# Patient Record
Sex: Male | Born: 1985 | Race: White | Hispanic: No | Marital: Single | Smoking: Never smoker
Health system: Southern US, Community
[De-identification: ages and names within clinical notes are randomized; demographics above are authoritative.]

---
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
## 2010-09-01 ENCOUNTER — Encounter: Admission: RE | Admit: 2010-09-01 | Discharge: 2010-09-01 | Payer: Self-pay | Admitting: Family Medicine

## 2010-12-19 ENCOUNTER — Encounter: Payer: Self-pay | Admitting: Family Medicine

## 2011-09-24 ENCOUNTER — Inpatient Hospital Stay (HOSPITAL_COMMUNITY)
Admission: RE | Admit: 2011-09-24 | Discharge: 2011-09-30 | Disposition: A | Discharge status: Home / Self Care | Payer: Medicaid Other | Source: Ambulatory Visit | Attending: Pulmonary Disease | Admitting: Pulmonary Disease

## 2011-09-24 ENCOUNTER — Inpatient Hospital Stay: Admit: 2011-09-24 | Payer: Self-pay

## 2011-09-24 ENCOUNTER — Inpatient Hospital Stay (HOSPITAL_COMMUNITY)
Admission: EM | Admit: 2011-09-24 | Discharge: 2011-09-24 | DRG: 682 | Disposition: A | Discharge status: Other Acute Inpatient Hospital | Payer: Medicaid Other | Attending: Internal Medicine | Admitting: Internal Medicine

## 2011-09-24 ENCOUNTER — Emergency Department (HOSPITAL_COMMUNITY): Payer: Medicaid Other

## 2011-09-24 DIAGNOSIS — Z79899 Other long term (current) drug therapy: Secondary | ICD-10-CM

## 2011-09-24 DIAGNOSIS — F840 Autistic disorder: Secondary | ICD-10-CM | POA: Diagnosis present

## 2011-09-24 DIAGNOSIS — T438X5A Adverse effect of other psychotropic drugs, initial encounter: Secondary | ICD-10-CM | POA: Diagnosis present

## 2011-09-24 DIAGNOSIS — R509 Fever, unspecified: Secondary | ICD-10-CM | POA: Diagnosis not present

## 2011-09-24 DIAGNOSIS — E876 Hypokalemia: Secondary | ICD-10-CM | POA: Diagnosis not present

## 2011-09-24 DIAGNOSIS — I469 Cardiac arrest, cause unspecified: Secondary | ICD-10-CM | POA: Diagnosis not present

## 2011-09-24 DIAGNOSIS — R569 Unspecified convulsions: Secondary | ICD-10-CM | POA: Diagnosis not present

## 2011-09-24 DIAGNOSIS — E871 Hypo-osmolality and hyponatremia: Secondary | ICD-10-CM | POA: Diagnosis present

## 2011-09-24 DIAGNOSIS — R55 Syncope and collapse: Secondary | ICD-10-CM | POA: Diagnosis not present

## 2011-09-24 DIAGNOSIS — I498 Other specified cardiac arrhythmias: Secondary | ICD-10-CM | POA: Diagnosis not present

## 2011-09-24 DIAGNOSIS — N179 Acute kidney failure, unspecified: Principal | ICD-10-CM | POA: Diagnosis present

## 2011-09-24 DIAGNOSIS — R197 Diarrhea, unspecified: Secondary | ICD-10-CM | POA: Diagnosis present

## 2011-09-24 DIAGNOSIS — F79 Unspecified intellectual disabilities: Secondary | ICD-10-CM | POA: Diagnosis present

## 2011-09-24 DIAGNOSIS — K7689 Other specified diseases of liver: Secondary | ICD-10-CM | POA: Diagnosis present

## 2011-09-24 DIAGNOSIS — G9340 Encephalopathy, unspecified: Secondary | ICD-10-CM | POA: Diagnosis not present

## 2011-09-24 DIAGNOSIS — I10 Essential (primary) hypertension: Secondary | ICD-10-CM | POA: Diagnosis present

## 2011-09-24 DIAGNOSIS — J984 Other disorders of lung: Secondary | ICD-10-CM | POA: Diagnosis not present

## 2011-09-24 DIAGNOSIS — E7800 Pure hypercholesterolemia, unspecified: Secondary | ICD-10-CM | POA: Diagnosis present

## 2011-09-24 LAB — DIFFERENTIAL
Eosinophils Absolute: 0.1 10*3/uL (ref 0.0–0.7)
Eosinophils Relative: 1 % (ref 0–5)
Lymphocytes Relative: 11 % — ABNORMAL LOW (ref 12–46)
Lymphocytes Relative: 9 % — ABNORMAL LOW (ref 12–46)
Lymphs Abs: 1.5 10*3/uL (ref 0.7–4.0)
Lymphs Abs: 1 10*3/uL (ref 0.7–4.0)
Monocytes Relative: 6 % (ref 3–12)
Neutro Abs: 11 10*3/uL — ABNORMAL HIGH (ref 1.7–7.7)
Neutrophils Relative %: 82 % — ABNORMAL HIGH (ref 43–77)
Neutrophils Relative %: 85 % — ABNORMAL HIGH (ref 43–77)

## 2011-09-24 LAB — LITHIUM LEVEL: Lithium Lvl: 4.17 mEq/L (ref 0.80–1.40)

## 2011-09-24 LAB — LIPID PANEL
Cholesterol: 174 mg/dL (ref 0–200)
HDL: 37 mg/dL — ABNORMAL LOW (ref >39)
Total CHOL/HDL Ratio: 4.7 RATIO
Triglycerides: 172 mg/dL — ABNORMAL HIGH (ref <150)

## 2011-09-24 LAB — CBC
HCT: 39.2 % (ref 39.0–52.0)
HCT: 38.1 % — ABNORMAL LOW (ref 39.0–52.0)
Hemoglobin: 13.2 g/dL (ref 13.0–17.0)
MCH: 30.1 pg (ref 26.0–34.0)
MCV: 90.3 fL (ref 78.0–100.0)
MCV: 90.3 fL (ref 78.0–100.0)
Platelets: 253 10*3/uL (ref 150–400)
RBC: 4.34 MIL/uL (ref 4.22–5.81)
RBC: 4.22 MIL/uL (ref 4.22–5.81)
WBC: 13.4 10*3/uL — ABNORMAL HIGH (ref 4.0–10.5)
WBC: 12.2 10*3/uL — ABNORMAL HIGH (ref 4.0–10.5)

## 2011-09-24 LAB — CARDIAC PANEL(CRET KIN+CKTOT+MB+TROPI)
CK, MB: 1.2 ng/mL (ref 0.3–4.0)
Relative Index: INVALID (ref 0.0–2.5)
Total CK: 46 U/L (ref 7–232)
Troponin I: <0.3 ng/mL (ref <0.30)

## 2011-09-24 LAB — URINE MICROSCOPIC-ADD ON

## 2011-09-24 LAB — URINALYSIS, ROUTINE W REFLEX MICROSCOPIC
Bilirubin Urine: NEGATIVE
Glucose, UA: NEGATIVE mg/dL
Hgb urine dipstick: NEGATIVE
Specific Gravity, Urine: 1.007 (ref 1.005–1.030)
pH: 6.5 (ref 5.0–8.0)

## 2011-09-24 LAB — COMPREHENSIVE METABOLIC PANEL
AST: 31 U/L (ref 0–37)
Albumin: 4.6 g/dL (ref 3.5–5.2)
Alkaline Phosphatase: 110 U/L (ref 39–117)
Chloride: 100 mEq/L (ref 96–112)
Creatinine, Ser: 2.25 mg/dL — ABNORMAL HIGH (ref 0.50–1.35)
Potassium: 4.1 mEq/L (ref 3.5–5.1)
Total Bilirubin: 0.1 mg/dL — ABNORMAL LOW (ref 0.3–1.2)
Total Protein: 7.8 g/dL (ref 6.0–8.3)

## 2011-09-24 LAB — HEMOGLOBIN A1C
Hgb A1c MFr Bld: 5.4 % (ref <5.7)
Mean Plasma Glucose: 108 mg/dL (ref <117)

## 2011-09-24 LAB — MAGNESIUM: Magnesium: 2.8 mg/dL — ABNORMAL HIGH (ref 1.5–2.5)

## 2011-09-24 LAB — LACTIC ACID, PLASMA: Lactic Acid, Venous: 1.6 mmol/L (ref 0.5–2.2)

## 2011-09-24 LAB — MRSA PCR SCREENING: MRSA by PCR: NEGATIVE

## 2011-09-24 LAB — PHOSPHORUS: Phosphorus: 3 mg/dL (ref 2.3–4.6)

## 2011-09-25 ENCOUNTER — Inpatient Hospital Stay (HOSPITAL_COMMUNITY): Payer: Medicaid Other

## 2011-09-25 DIAGNOSIS — I469 Cardiac arrest, cause unspecified: Secondary | ICD-10-CM

## 2011-09-25 DIAGNOSIS — I495 Sick sinus syndrome: Secondary | ICD-10-CM

## 2011-09-25 DIAGNOSIS — F79 Unspecified intellectual disabilities: Secondary | ICD-10-CM

## 2011-09-25 LAB — ALT: ALT: 57 U/L — ABNORMAL HIGH (ref 0–53)

## 2011-09-25 LAB — CBC
Hemoglobin: 12.2 g/dL — ABNORMAL LOW (ref 13.0–17.0)
MCH: 30.9 pg (ref 26.0–34.0)
MCHC: 34.2 g/dL (ref 30.0–36.0)
Platelets: 221 10*3/uL (ref 150–400)
RDW: 12.7 % (ref 11.5–15.5)

## 2011-09-25 LAB — GLUCOSE, CAPILLARY
Glucose-Capillary: 107 mg/dL — ABNORMAL HIGH (ref 70–99)
Glucose-Capillary: 128 mg/dL — ABNORMAL HIGH (ref 70–99)
Glucose-Capillary: 113 mg/dL — ABNORMAL HIGH (ref 70–99)

## 2011-09-25 LAB — CK TOTAL AND CKMB (NOT AT ARMC)
Relative Index: INVALID (ref 0.0–2.5)
Total CK: 36 U/L (ref 7–232)

## 2011-09-25 LAB — BASIC METABOLIC PANEL
Calcium: 10.2 mg/dL (ref 8.4–10.5)
GFR calc Af Amer: 66 mL/min — ABNORMAL LOW (ref >90)
GFR calc non Af Amer: 57 mL/min — ABNORMAL LOW (ref >90)
Sodium: 139 mEq/L (ref 135–145)

## 2011-09-25 LAB — LITHIUM LEVEL: Lithium Lvl: 1.22 mEq/L (ref 0.80–1.40)

## 2011-09-25 LAB — HEPATITIS B SURFACE ANTIGEN: Hepatitis B Surface Ag: NEGATIVE

## 2011-09-25 LAB — URINE CULTURE: Colony Count: 2000

## 2011-09-25 LAB — TROPONIN I: Troponin I: <0.3 ng/mL (ref <0.30)

## 2011-09-25 LAB — HEPATITIS B CORE ANTIBODY, TOTAL: Hep B Core Total Ab: NEGATIVE

## 2011-09-25 NOTE — Consult Note (Signed)
[name], Marshall NO.:  [id number]  MEDICAL RECORD NO.:  [id number]  LOCATION:  2609                         FACILITY:  [facility]  PHYSICIAN:  Maree Krabbe, M.D.DATE OF BIRTH:  Feb 10, 1986  DATE OF CONSULTATION:  09/24/2011 DATE OF DISCHARGE:                                CONSULTATION   REASON FOR CONSULT:  Lithium toxicity and need for dialysis.  HISTORY:  [name], mental retardation and high blood pressure  brought in by his primary caregiver for altered mental status.  He has been having confusion and lethargy for the last 6 days.  Usually, he is able to ambulate and feed himself.  His Haldol dose was decreased but that did not help.  He started having diarrhea also.  He was evaluated in the emergency room and found to have an elevated creatinine at 2.2 and a lithium level of 4.17, which is considered to be markedly elevated.  The Renal Service was asked to see the patient for consideration of hemodialysis for lithium toxicity.  The  urinalysis done by the lab was entirely negative except for 3-6 white blood cells.  PAST MEDICAL HISTORY: 1. Autism. 2. Mental retardation. 3. Dyslipidemia. 4. Hypertension.  SURGICAL HISTORY:  Noncontributory.  ALLERGIES:  None.  SOCIAL HISTORY:  No history of tobacco, alcohol or drug abuse.  FAMILY HISTORY:  None.  MEDICATIONS:  At home are: 1. Lisinopril 20 daily. 2. Lithium 300 mg 3 caps b.i.d. 3. Minocycline 100 daily for acne. 4. Topiramate 50 b.i.d. 5. Haldol 2 in the morning and 2 at night.  REVIEW OF SYSTEMS:  The patient is not responding.  The caregiver denies any recent evidence of shortness of breath, fever, chills, sweats, vomiting, difficulty voiding, joint swelling.  PHYSICAL EXAMINATION:  VITAL SIGNS:  Blood pressure 130/80, pulse 130, respirations 20, temp 99.3, O2 saturation 100% on room air. GENERAL:  The patient is in no distress.  He will  verbalize intermittently.  He has been receiving some Ativan, so he is a bit sedated. SKIN:  Warm and dry without rash, cyanosis, or edema.  He does have facial acne. HEENT:  PERRLA, EOMI.  Throat is moist. NECK:  Supple without JVD. CHEST:  Clear throughout.  No rales, rhonchi, or wheezing. CARDIAC:  Regular rate and rhythm without murmur, rub, or gallop. ABDOMEN:  Soft, nontender, normoactive bowel sounds. GU:  Normal male genitalia. EXTREMITIES:  No lower extremity edema or no joint effusions or deformity.  Pulses are excellent in the feet. NEUROLOGIC:  He is moving all his arms and moving his extremities all equally well.  No gross cranial nerve deficits.  LABORATORY DATA:  Sodium 129, potassium 4.1, bicarb 21, BUN 49, creatinine 2.25, glucose 116, calcium 10.9.  White count 13,000, hemoglobin 13.  Head CT showed no acute changes.  Chest x-ray unremarkable.  Lithium level was 4.17, normal range is 0.8-1.4.  IMPRESSION: 1. Altered mental status due to lithium toxicity.  Lithium level is     dangerously high.  Acute hemodialysis is indicated.  We will place a     temporary catheter and plan acute dialysis this evening.  He may  need another treatment depending on levels.  Would also keep in     touch with poison control during this process. 2. Renal insufficiency, probably acute.  This is most likely due to     dehydration with the patient's recent confusion, poor oral intake     and diarrhea.  Urinalysis is negative. 3. Leukocytosis. 4. Hypertension.  Would hold ACE inhibitor for now with elevated     creatinine. 5. If blood pressure medications are needed, use intravenous beta-blocker     or labetalol or metoprolol.  We will put a p.r.n. order for     labetalol. 6. Autism with mental retardation.  PLAN:  See orders.     Maree Krabbe, M.D.     RDS/MEDQ  D:  09/24/2011  T:  09/25/2011  Job:  [id number]  Electronically Signed by Delano Metz M.D. on 09/25/2011  12:53:22 PM

## 2011-09-25 NOTE — H&P (Signed)
NAME:  Douglas Marshall, Douglas Marshall NO.:  [id number]  MEDICAL RECORD NO.:  [id number]  LOCATION:  WLED                         FACILITY:  [facility]  PHYSICIAN:  Manson Passey, MD        DATE OF BIRTH:  October 25, 1986  DATE OF ADMISSION:  09/24/2011 DATE OF DISCHARGE:                             HISTORY & PHYSICAL   PRIMARY CARE PHYSICIAN:  Tomma Lightning, MD  CHIEF COMPLAINT:  Altered mental status for about 1 week.  HISTORY OF PRESENT ILLNESS:  The patient is a 25 year old male with a history of autism, mental retardation, dyslipidemia (diet controlled), hypertension, who was brought by primary care giver Gweneth Fritter) for change in the mental status about 6 days ago prior to the admission.  As per primary care giver, the patient started acting strange, which she describes as not being to the able to express himself and when he does the response is slower and has lots of pauses.  The patient is unable to complete the sentences whereas his usual mental status is that he is able to focus and give appropriate responses when asked questions, he follows commands appropriately.  She has called the patient's primary psychiatrist and was told to decrease the dose of Haldol from his usual dosage 4 mg at bedtime to 2 mg at bedtime. Primary care giver explained that she thought it is going to take a few days before the medication starts ,working so they have not seek medical attention prior to today.  Primary caregiver also notes that the patient did have over last month or so, some behavioral changes where he was more psychotic.  She also reports that the patient started having diarrhea since Wednesday, with no blood in it, few episodes a day with the last episode being on the morning of admission.  In addition, she reports that the patient started drooling excessively over the last few days or so.  There are no complaints of chest pain or cough or shortness of breath.  There are  no complaints of nausea or vomiting or abdominal pain.  No fever or chills or night sweats.  No complaints of lightheadedness or dizziness or loss of consciousness or blurry vision. There are no reports of blood in the stool or urine.  The patient is admitted to hospitalist service for further evaluation and management.  PAST MEDICAL HISTORY: 1. Autism. 2. Mental retardation. 3. Dyslipidemia. 4. Hypertension.  SURGICAL HISTORY:  Noncontributing.  ALLERGIES:  No known drug allergies.  SOCIAL HISTORY:  The patient has no history of smoking cigarettes, alcohol abuse, or illegal drug abuse.  FAMILY HISTORY:  No significant medical family history.  MEDICATIONS AT HOME: 1. Lisinopril 20 mg daily. 2. Lithium carbonate 300 mg 3 caps twice daily. 3. Minocycline 100 mg daily for acne. 4. Topiramate 50 mg twice daily. 5. Haldol 2 mg in the morning and 2 mg at night. 6. Lisinopril 10 mg 2 tablets daily.  REVIEW OF SYSTEMS:  As per HPI.  PHYSICAL EXAMINATION:  VITAL SIGNS:  Blood pressure 135/81, pulse 123, respiration 20, temperature 99.3 Fahrenheit, oxygen saturation 100% on room air. GENERAL APPEARANCE:  The patient is in no acute  distress, but does not exhibit occasional episodes of agitation. HEENT:  Normocephalic/atraumatic, pupils equally round, reactive to light and accommodation, extraocular muscles intact, no tonsillar erythema or exudates. NECK:  Supple, no lymphadenopathy, no JVD. LUNGS:  Clear to auscultation bilaterally, no wheezing, no rhonchi, no rales. CARDIOVASCULAR:  Positive S1, S2, regular rate and rhythm, the patient is tachycardic. ABDOMEN:  Positive bowel sounds, soft, nontender/nondistended. EXTREMITIES:  Pulses palpable bilaterally, no lower extremity edema or cyanosis.  SKIN:  Warm, dry, facial acne. NEUROLOGIC:  Patient is alert, awake, and oriented to time, place, and person, but gives very slow responses with long pauses.  LABORATORY:  Sodium 129,  potassium 4.1, chloride 100, bicarb 21, BUN 49, creatinine 2.25, glucose 116, calcium 10.9, white blood cells 13.4, hemoglobin 13.2, hematocrit 39.2, platelets 239.  Lactic acid 1.6. Urinalysis trace leukocytes.  DIAGNOSTIC STUDIES: 1. Head CT without contrast shows no acute intracranial findings, mass     shift, or edema. 2. Chest x-ray shows no active cardiopulmonary disease.  ASSESSMENT AND PLAN:  25 year old male with a history of autism, mental retardation, hypertension, brought for altered mental status. 1. Hyponatremia.  Possible etiology includes dehydration versus     infection versus psychogenic polydipsia.  Patient's primary     caregiver reports that the patient has a habit of drinking a lot of     fluid as much as a 6 L a day and over last few days.  He has been     drinking about 3 L a day.  We will continue IV normal saline at 100     mL an hour, but we will restrict IV fluids to not more than 1.5 L a     day.  Continue to monitor sodium.  Further management is dependent     on sodium trend.  We will also start ceftriaxone for possible     urinary tract infection as the patient does have elevated whiteblood cell count, and trace leukocytes in the urinalysis.  We will     start ceftriaxone 1 g every 24 hours IV for 5 days.  We will also     send stool for stool culture, as well as C diff and we will start     Flagyl 500 mg every 8 hours IV until the final results of C diff. 2. Acute kidney injury.  This could perhaps be related to dehydration,     prerenal.  We will continue IV fluids up to 1.5 L a day, and     continue to monitor BUN and creatinine.  There are no previous     laboratory values to assess whether this is acute or chronic renal     insufficiency. 3. Hypertension.  At present, blood pressure is at goal 135/81.     Please continue lisinopril 20 mg daily once the patient is     evaluated by Speech and Swallow Therapy. 4. History of behavioral changes,  psychiatric history.  At present,     the patient exhibits occasional episodes of agitation.  Psychiatric     medications can be continued after speech and swallow evaluation     which includes topiramate 50 mg twice daily, Haldol 2 mg twice     daily.  Please hold off on lithium for now.  For agitation, we will     give Ativan 1 mg every 8 hours by IV as needed for agitation. 5. Hypercalcemia.  As of right now, it is unclear etiology of  hypercalcemia but the possibility include lithium.  Please obtain     lithium level and hold lithium temporarily until we see the     resolution of hypercalcemia. 6. Social work involvement for safe discharge plan.  We will obtain     PT, OT, and ST evaluation prior to patient's discharge. 7. Diet.  NPO until speech and swallow evaluation is completed. 8. Education.  Patient's primary caregiver is aware of plan of care     and treatment. Time spent admitting the patient more than 35 minutes.          ______________________________ Manson Passey, MD     AD/MEDQ  D:  09/24/2011  T:  09/24/2011  Job:  [id number]  Electronically Signed by Manson Passey MD on 09/25/2011 12:34:11 PM

## 2011-09-26 ENCOUNTER — Inpatient Hospital Stay (HOSPITAL_COMMUNITY): Payer: Medicaid Other

## 2011-09-26 DIAGNOSIS — F71 Moderate intellectual disabilities: Secondary | ICD-10-CM

## 2011-09-26 DIAGNOSIS — I517 Cardiomegaly: Secondary | ICD-10-CM

## 2011-09-26 DIAGNOSIS — T438X4A Poisoning by other psychotropic drugs, undetermined, initial encounter: Secondary | ICD-10-CM

## 2011-09-26 DIAGNOSIS — R569 Unspecified convulsions: Secondary | ICD-10-CM

## 2011-09-26 DIAGNOSIS — I469 Cardiac arrest, cause unspecified: Secondary | ICD-10-CM

## 2011-09-26 DIAGNOSIS — T438X1A Poisoning by other psychotropic drugs, accidental (unintentional), initial encounter: Secondary | ICD-10-CM

## 2011-09-26 DIAGNOSIS — T438X5A Adverse effect of other psychotropic drugs, initial encounter: Secondary | ICD-10-CM

## 2011-09-26 LAB — DIFFERENTIAL
Eosinophils Absolute: 0.4 10*3/uL (ref 0.0–0.7)
Monocytes Absolute: 1.3 10*3/uL — ABNORMAL HIGH (ref 0.1–1.0)
Neutrophils Relative %: 73 % (ref 43–77)

## 2011-09-26 LAB — COMPREHENSIVE METABOLIC PANEL
ALT: 50 U/L (ref 0–53)
Albumin: 3.4 g/dL — ABNORMAL LOW (ref 3.5–5.2)
Albumin: 4 g/dL (ref 3.5–5.2)
Alkaline Phosphatase: 91 U/L (ref 39–117)
Alkaline Phosphatase: 107 U/L (ref 39–117)
BUN: 10 mg/dL (ref 6–23)
CO2: 21 mEq/L (ref 19–32)
Chloride: 108 mEq/L (ref 96–112)
GFR calc Af Amer: >90 mL/min (ref >90)
GFR calc non Af Amer: >90 mL/min (ref >90)
Glucose, Bld: 143 mg/dL — ABNORMAL HIGH (ref 70–99)
Potassium: 4.5 mEq/L (ref 3.5–5.1)
Potassium: 3.8 mEq/L (ref 3.5–5.1)
Sodium: 135 mEq/L (ref 135–145)
Total Bilirubin: 0.6 mg/dL (ref 0.3–1.2)
Total Protein: 6.3 g/dL (ref 6.0–8.3)

## 2011-09-26 LAB — CBC
HCT: 34.8 % — ABNORMAL LOW (ref 39.0–52.0)
MCHC: 33.5 g/dL (ref 30.0–36.0)
MCHC: 33.3 g/dL (ref 30.0–36.0)
MCV: 90.2 fL (ref 78.0–100.0)
RDW: 12.5 % (ref 11.5–15.5)
RDW: 12.4 % (ref 11.5–15.5)
WBC: 15.6 10*3/uL — ABNORMAL HIGH (ref 4.0–10.5)

## 2011-09-26 LAB — LITHIUM LEVEL: Lithium Lvl: 1.53 mEq/L (ref 0.80–1.40)

## 2011-09-26 LAB — PROTIME-INR: INR: 1.07 (ref 0.00–1.49)

## 2011-09-26 LAB — CARDIAC PANEL(CRET KIN+CKTOT+MB+TROPI)
CK, MB: 1.5 ng/mL (ref 0.3–4.0)
Relative Index: INVALID (ref 0.0–2.5)
Total CK: 35 U/L (ref 7–232)
Troponin I: <0.3 ng/mL (ref <0.30)

## 2011-09-26 LAB — URINE CULTURE: Culture: NO GROWTH

## 2011-09-26 LAB — GLUCOSE, CAPILLARY: Glucose-Capillary: 144 mg/dL — ABNORMAL HIGH (ref 70–99)

## 2011-09-27 ENCOUNTER — Inpatient Hospital Stay (HOSPITAL_COMMUNITY): Payer: Medicaid Other

## 2011-09-27 DIAGNOSIS — I469 Cardiac arrest, cause unspecified: Secondary | ICD-10-CM

## 2011-09-27 DIAGNOSIS — F71 Moderate intellectual disabilities: Secondary | ICD-10-CM

## 2011-09-27 DIAGNOSIS — T438X4A Poisoning by other psychotropic drugs, undetermined, initial encounter: Secondary | ICD-10-CM

## 2011-09-27 DIAGNOSIS — T4391XA Poisoning by unspecified psychotropic drug, accidental (unintentional), initial encounter: Secondary | ICD-10-CM

## 2011-09-27 DIAGNOSIS — I495 Sick sinus syndrome: Secondary | ICD-10-CM

## 2011-09-27 LAB — POCT I-STAT 3, ART BLOOD GAS (G3+)
Acid-base deficit: 5 mmol/L — ABNORMAL HIGH (ref 0.0–2.0)
Bicarbonate: 18.1 mEq/L — ABNORMAL LOW (ref 20.0–24.0)
TCO2: 19 mmol/L (ref 0–100)
pCO2 arterial: 34.4 mmHg — ABNORMAL LOW (ref 35.0–45.0)
pCO2 arterial: 33.5 mmHg — ABNORMAL LOW (ref 35.0–45.0)
pH, Arterial: 7.343 — ABNORMAL LOW (ref 7.350–7.450)
pO2, Arterial: 76 mmHg — ABNORMAL LOW (ref 80.0–100.0)
pO2, Arterial: 88 mmHg (ref 80.0–100.0)

## 2011-09-27 LAB — CBC
MCH: 30.1 pg (ref 26.0–34.0)
MCV: 89.9 fL (ref 78.0–100.0)
Platelets: 161 10*3/uL (ref 150–400)
RDW: 12.2 % (ref 11.5–15.5)
WBC: 11.6 10*3/uL — ABNORMAL HIGH (ref 4.0–10.5)

## 2011-09-27 LAB — POCT I-STAT 3, VENOUS BLOOD GAS (G3P V)
Acid-base deficit: 4 mmol/L — ABNORMAL HIGH (ref 0.0–2.0)
Bicarbonate: 19.3 mEq/L — ABNORMAL LOW (ref 20.0–24.0)

## 2011-09-27 LAB — GLUCOSE, CAPILLARY: Glucose-Capillary: 125 mg/dL — ABNORMAL HIGH (ref 70–99)

## 2011-09-27 LAB — COMPREHENSIVE METABOLIC PANEL
AST: 15 U/L (ref 0–37)
Albumin: 3.3 g/dL — ABNORMAL LOW (ref 3.5–5.2)
Calcium: 9.6 mg/dL (ref 8.4–10.5)
Chloride: 108 mEq/L (ref 96–112)
Creatinine, Ser: 0.92 mg/dL (ref 0.50–1.35)

## 2011-09-27 NOTE — Consult Note (Signed)
Douglas Marshall, Douglas Marshall NO.:  [id number]  MEDICAL RECORD NO.:  [id number]  LOCATION:  2905                         FACILITY:  [facility]  PHYSICIAN:  Carmell Austria, MD        DATE OF BIRTH:  23-Dec-1985  DATE OF CONSULTATION:  09/26/2011 DATE OF DISCHARGE:                                CONSULTATION   CHIEF COMPLAINT:  "Rule out seizures."  HISTORY OF PRESENT ILLNESS:  This is a 25 year old male with history of mental retardation who has been on long-term lithium and on Haldol with recent additions of Topamax.  He came in with increased lethargy for several days and was found to have lithium toxicity with a level of 4.17.  He also came in with acute renal failure. He was having diarrhea and possibly had some dehydration related to Lithium toxicity prior to coming to the hospital.  He was also recently started on lisinopril.  This morning, his lithium went down to 1.53 and he was given 5 mg of Abilify, which he has never gotten in the past. Patient had also recieved Haldol. The patient was  given these medications for agitation and soon afterwards the patient had 2 episodes of asystole, lasting about 30 seconds.  During these episodes, the patient appeared to be staring off and seizures was suspected. Otherwise, the patient has a history of autism.  PAST MEDICAL HISTORY: 1. Autism. 2. Hypertension. 3. Hyperlipidemia.  MEDICATIONS ON ADMISSION:  Lisinopril, lithium was held, minocycline, Topamax, Haldol, Abilify was substituted.  ALLERGIES:  The patient has no known drug allergies.  SOCIAL HISTORY:  The patient lives with his caregiver.  No toxic habits.  FAMILY HISTORY:  No significant family history of heart disease.  PHYSICAL EXAMINATION:  VITAL SIGNS:  At this time, temp 99.4, pulse 114, respirations 26, blood pressure 114/26. NEUROLOGICAL:  Limited by the patient's underlying mental status.  The patient is minimally verbal.  He does say occasional simple  words.  He does attend to the examiner.  He follows some simple commands, but inconsistently. HEENT:  Extraocular movements were intact.  Pupils were equal, round, reactive to light and accommodation.  There was no nystagmus.  He had positive blink to threat bilaterally.  There was no facial asymmetry. Motor: grossly his motor function was intact, but a full exam was not  possible due to poor patient complaint.  Sensation was intact to pinprick  throughout. Cognition was intact to finger-to-nose bilaterally.   Reflexes were 2+ in the upper extremities, 2+ at the knees, 2+ at ankles.   Downgoing plantars.  Gait was deferred as patient is not allowed to ambulate at this time.  LABORATORY DATA:  As I mentioned, his lithium recently was 1.53, his cardiac panel was negative.  His CMP will note BUN was 10, creatinine was 1.0, glucose was 143.  His H and H significant for white count of 15.6, H and H is 11.6 and 34.8.  He is slightly anemic, platelets were 193.  Head CT was done 2 days ago showing no acute stroke.  Chest x-ray was done 2 days ago as well and showed no pneumonia.  IMPRESSION:  This is a  24 year old man with 2 hypotensive episodes secondary to asystole during which he appeared to be staring off into space and seizures was suspected.  My impression is that these events were most likely due to hypoperfusion of the brain.  During times when there is hypoperfusion of the brain, the patient can appear to be staring off and this is a frequent finding in patient with very low blood  pressure.  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  I will follow.          ______________________________ Carmell Austria, MD     DB/MEDQ  D:   09/26/2011  T:  09/26/2011  Job:  [id number]  Electronically Signed by Carmell Austria MD on 09/27/2011 11:50:56 AM

## 2011-09-27 NOTE — Procedures (Signed)
EEG NUMBER:  [id number]  This routine EEG was requested in this 25 year old man with a history of mental retardation and autism.  His lithium doses were adjusted recently secondary to behavioral issues.  His family states the patient has had increased drooling, altered mental status and has been somnolent for the last 4-5 days.  He also had been noted to start on Abilify recently.  His medications include haloperidol, lorazepam, topiramate, hydroxyzine, Phenergan, zolpidem and lithium.  The EEG was done with the patient awake.  During periods of maximal wakefulness no obvious alpha rhythm could be seen.  Background activities were composed by low-amplitude beta activities with intermixed poorly organized alpha activities.  There were frequent bursts of frontally dominant intermittent rhythmic =delta activities(FIRDA).  Photic stimulation did not produce a driving response.  Hyperventilation was not performed due to the patient's inability to comply.  The patient did not sleep.  CLINICAL INTERPRETATION:  This routine EEG done with the patient awake is abnormal.  Background activities, including the presence of frontally dominant intermittent rhythmic delta activities, are too slow suggesting a mild to moderate encephalopathy of nonspecific etiology. No interictal epileptiform discharges, electrographic seizures or nonconvulsive status epilepticus was seen.          ______________________________ Denton Meek, MD    [id number] D:  09/26/2011 14:09:13  T:  09/26/2011 14:44:05  Job #:  [id number]

## 2011-09-28 LAB — BASIC METABOLIC PANEL
CO2: 20 mEq/L (ref 19–32)
Calcium: 10.2 mg/dL (ref 8.4–10.5)
Chloride: 107 mEq/L (ref 96–112)
Creatinine, Ser: 0.84 mg/dL (ref 0.50–1.35)
Glucose, Bld: 112 mg/dL — ABNORMAL HIGH (ref 70–99)

## 2011-09-28 LAB — CBC
HCT: 32.7 % — ABNORMAL LOW (ref 39.0–52.0)
Hemoglobin: 11.3 g/dL — ABNORMAL LOW (ref 13.0–17.0)
MCH: 30.4 pg (ref 26.0–34.0)
MCV: 87.9 fL (ref 78.0–100.0)
RBC: 3.72 MIL/uL — ABNORMAL LOW (ref 4.22–5.81)
WBC: 8.9 10*3/uL (ref 4.0–10.5)

## 2011-09-29 DIAGNOSIS — I495 Sick sinus syndrome: Secondary | ICD-10-CM

## 2011-09-29 DIAGNOSIS — T438X4A Poisoning by other psychotropic drugs, undetermined, initial encounter: Secondary | ICD-10-CM

## 2011-09-29 DIAGNOSIS — T438X1A Poisoning by other psychotropic drugs, accidental (unintentional), initial encounter: Secondary | ICD-10-CM

## 2011-09-29 DIAGNOSIS — I469 Cardiac arrest, cause unspecified: Secondary | ICD-10-CM

## 2011-09-29 DIAGNOSIS — F71 Moderate intellectual disabilities: Secondary | ICD-10-CM

## 2011-09-29 DIAGNOSIS — T4391XA Poisoning by unspecified psychotropic drug, accidental (unintentional), initial encounter: Secondary | ICD-10-CM

## 2011-09-29 LAB — GLUCOSE, CAPILLARY
Glucose-Capillary: 156 mg/dL — ABNORMAL HIGH (ref 70–99)
Glucose-Capillary: 112 mg/dL — ABNORMAL HIGH (ref 70–99)
Glucose-Capillary: 112 mg/dL — ABNORMAL HIGH (ref 70–99)
Glucose-Capillary: 103 mg/dL — ABNORMAL HIGH (ref 70–99)

## 2011-09-29 LAB — BASIC METABOLIC PANEL
CO2: 20 mEq/L (ref 19–32)
Calcium: 9.8 mg/dL (ref 8.4–10.5)
Chloride: 107 mEq/L (ref 96–112)
Creatinine, Ser: 0.79 mg/dL (ref 0.50–1.35)
GFR calc Af Amer: >90 mL/min (ref >90)
Sodium: 140 mEq/L (ref 135–145)

## 2011-09-29 LAB — STOOL CULTURE

## 2011-09-29 LAB — LITHIUM LEVEL: Lithium Lvl: <0.25 mEq/L — ABNORMAL LOW (ref 0.80–1.40)

## 2011-09-30 LAB — GLUCOSE, CAPILLARY
Glucose-Capillary: 101 mg/dL — ABNORMAL HIGH (ref 70–99)
Glucose-Capillary: 148 mg/dL — ABNORMAL HIGH (ref 70–99)

## 2011-09-30 LAB — CULTURE, BLOOD (ROUTINE X 2)
Culture  Setup Time: 201210271722
Culture: NO GROWTH

## 2011-09-30 LAB — LIPID PANEL
HDL: 32 mg/dL — ABNORMAL LOW (ref >39)
LDL Cholesterol: 90 mg/dL (ref 0–99)
Total CHOL/HDL Ratio: 4.9 RATIO

## 2011-09-30 NOTE — Consult Note (Signed)
Douglas Marshall, Douglas Marshall NO.:  [id number]  MEDICAL RECORD NO.:  [id number]  LOCATION:  2905                         FACILITY:  [facility]  PHYSICIAN:  Douglas Buckles. Kue Fox, MDDATE OF BIRTH:  [birth year]  DATE OF CONSULTATION:  09/26/2011 DATE OF DISCHARGE:                                CONSULTATION   REQUESTING PHYSICIAN:  [facility].  REASON FOR CONSULTATION:  Recurrent sinus pauses with asystolic arrest.  Douglas Marshall is a 25 year old man with a history of mental retardation, autism, and hypertension.  He is unable to give adequate history at this point.  All history is obtained from a chart of the nurse and his primary caregiver, Douglas Marshall.  Apparently, he has a relatively high functioning autistic male.  He attends a group school.  Douglas Marshall says that he has been doing relatively well, however, in February began to have behavioral issues and was acting out.  At that time, his lithium dose was adjusted.  More recently, he was seen in his primary care doctor's office with his diastolic pressure well over 100.  He was started on lisinopril.  Over the past 4-5 days, he has had increased drooling and altered mental status.  He became quite somnolent.  She brought him to the emergency room for further evaluation.  He was found to have lithium toxicity with a lithium level of 4.2.  He was seen emergently by the Renal Service and underwent placement of a right groin Vas-Cath with emergent hemodialysis.  He was also started on Abilify. Several hours after being started on Abilify, he had intermittent bradycardia with brief pauses.  His caregiver then thought she noticed some seizure activity.  He developed a 15-second pause while on the step- down unit 2600.  He was treated with atropine and developed sinus tachycardia.  He has moved to the Intensive Care Unit.  This morning, he had recurrent pauses of 13 seconds and 35 seconds associated with loss of consciousness.   There was no witnessed seizure activity related to these events.  It was reported that he was just sleeping and then dropped his heart rate quickly and became even less responsive.  This recovered spontaneously.  He did not receive atropine again.  He is currently quite somnolent.  He is unable to answer any questions or follow commands.  He does respond to pain and he moves all 4 extremities spontaneously.  REVIEW OF SYSTEMS:  According to Douglas Marshall, he has not had any history of cardiac issues.  He has never had a cardiac workup.  There is no previous history of syncope.  He did have seizures as a young child, but this has been many years ago.  He has had some recent diarrhea, but all other systems are negative except for HPI and problem list.  PROBLEM LIST: 1. Autism. 2. Mental retardation. 3. Hypertension. 4. Hyperlipidemia.  MEDICATIONS ON ADMISSION:  Lisinopril 20 a day, lithium, minocycline, Topamax, Haldol.  Lithium has been held.  He is now on Abilify.  He has no known drug allergies.  SOCIAL HISTORY:  He lives with his caregiver.  He does not smoke or drink alcohol.  FAMILY HISTORY:  There is no  significant family history of cardiac disease known.  PHYSICAL EXAM:  GENERAL:  He is somnolent. VITAL SIGNS:  Blood pressure currently is 105/57 with a heart rate of 105.  She is saturating 98% on room air. HEENT:  Normal except for acne. NECK:  Supple.  No JVD.  Carotids are 2+ bilaterally without bruits. There is no lymphadenopathy or thyromegaly. CARDIAC:  PMI is not displaced. HEART:  Heart sounds are distant, but he is regular with no obvious murmurs. LUNGS:  Clear anteriorly. ABDOMEN:  Soft, nontender, and nondistended.  No hepatosplenomegaly.  No bruits palpated. EXTREMITIES:  Warm.  There is a right groin Vas-Cath which looks okay. No cyanosis, clubbing, or edema.  No rash. NEURO:  Once again, he is somnolent.  He responses to pain, but otherwise does not  respond to commands.  EKG shows sinus tach at 110 beats per minute.  There are no significant ST-T wave abnormalities.  QRS duration is 94 msec.  PR duration is 182 msec, QTc is 414 msec.  Sodium is 139, potassium 4.5, BUN is 35, creatinine 1.6 which is down from 2.2 on admission.  Lithium is down to 1.2.  White count 17.6, hemoglobin 12.2, and platelets are 221.  ASSESSMENT: 1. Asystolic arrest, recurrent. 2. Acute renal failure, resolving. 3. Acute respiratory distress. 4. Mental retardation and autism. 5. Lithium toxicity, status post emergent dialysis.  PLAN/DISCUSSION:  I have reviewed his EKG and tele strips.  His tele strips are quite impressive.  I am unsure what the etiology of his sinus arrest.  I think in the differential you can consider medications, seizure, and apnea.  Given his age and normal baseline EKG, I doubt that this is primary rhythm issue, however, I cannot exclude that completely. I would recommend discontinuation of Abilify, check a 2-D echo and thyroid panel as well as a consult Neurology for possible seizure workup.  If episodes continue, he will need a transvenous pacing until we get this sorted out.  I will discuss with Electrophysiology.  A total time spent 50 minutes.     Douglas Buckles. Douglas Machamer, Marshall     DRB/MEDQ  D:  09/26/2011  T:  09/26/2011  Job:  [id number]  Electronically Signed by Douglas Meres Marshall on 09/30/2011 02:10:47 PM

## 2011-10-02 NOTE — Consult Note (Signed)
NAME:  Douglas Marshall NO.:  [id number]  MEDICAL RECORD NO.:  [id number]  LOCATION:                                 FACILITY:  PHYSICIAN:  Conni Slipper, MDDATE OF BIRTH:  November 25, 1986  DATE OF CONSULTATION:09/25/2011 DATE OF DISCHARGE:                                CONSULTATION   Douglas Marshall is a 25 year old single Caucasian male with diagnosis of autism, mental retardation, and hypertension.  The patient was admitted to the [facility] for lithium toxicity.  The patient's lithium levels were 4.17 on admission, and he has also had elevated creatinine at 2.2.  Reportedly, the patient has been under the care of the caregiver for the last 60 years and before that he was in [place]. Reportedly, he has been taking his medication from outpatient psychiatric services at [facility].  The patient had his last medication evaluation in February 2012.  At that time, his medication was increased lithium 1500 daily to the 1800 daily.  His lithium level checked about a month and half ago was within normal level.  The patient reportedly has loose motions in the recent past, which might cause that his lithium levels become toxic.  He also started a high blood pressure medication, which may have contributed to it.  The patient reportedly has been lethargic and altered mental status, unable to respond.  He has been fooling when he was came in.  The patient has been getting stable at this current point.  He has been able to eat pureed diet and has IV fluids diet.  He is able to drink coke with ice, which he is frequently asking for.  PAST PSYCHIATRIC HISTORY:  The patient has no reported psychiatric admissions.  SOCIAL HISTORY:  He has been living with a caretaker.  His mother is legal guardian who is in [place] at this time.  Unable to get the more information from her side.  FAMILY HISTORY:  The patient has a family history of cholesterol  and high blood pressure.  MENTAL STATUS EXAMINATION:  The patient was lying down on his bed with IV fluids and catheter.  The patient has closed his eyes, but talking loudly with caretaker calling her name and asking for more drink.  The patient also has shaking on his extremities from time to time.  DIAGNOSES: 1. Lithium toxicity. 2. Autism with mental retardation. 3. Hyperlipidemia and hypertension.  TREATMENT PLAN:  Recommended no lithium therapy at this time. Recommended to increase his Topamax from 50 mg to 100 mg twice a day and he may use Risperdal up to 4 mg a day from 0.25 mg twice a day to 4 mg a day and alternative was Abilify, may start at 2 mg to maximize to about 20 mg a day for controlling irritability, agitation, and aggressive behaviors.  The patient's caretaker would like to see him off of his Haldol and lithium, which he has been taking for 6 years.  Agree with the request at this time.  If you have any more questions, please contact the psychiatric consult at [phone].  I appreciate the psychiatric consult on [facility]  Conni Slipper, MD     JRJ/MEDQ  D:  09/25/2011  T:  09/26/2011  Job:  [id number]  Electronically Signed by Leata Mouse MD on 10/02/2011 09:26:41 AM

## 2011-10-03 NOTE — Discharge Summary (Addendum)
NAMESIERRA, BISSONETTE NO.:  [id number]  MEDICAL RECORD NO.:  [id number]  LOCATION:  6737                         FACILITY:  [facility]  PHYSICIAN:  Leslye Peer, MD    DATE OF BIRTH:  04-06-1986  DATE OF ADMISSION:  09/24/2011 DATE OF DISCHARGE:  09/30/2011                              DISCHARGE SUMMARY   DISCHARGE DIAGNOSES: 1. Bradycardic arrest secondary to lithium toxicity. 2. Encephalopathy/questionable seizures. 3. Acute renal failure. 4. History of autism/mood disorder. 5. History of fatty liver. 6. History of hypertension/hyperlipidemia.  HOSPITAL CONSULTANTS: 1. Dr. Lyman Speller of [facility]. 2. Dr. Elsie Saas of psychiatry. 3. Dr. Arvilla Meres of [facility].  LINES/TUBES: 1. On September 25, 2011, a right femoral hemodialysis catheter was     placed and removed on September 26, 2011. 2. A temporary pacemaker wire was placed on September 27, 2011, and     removed on September 28, 2011.  ANTIBIOTIC DATA: 1. The patient was placed on Rocephin from September 24, 2011 to September 26, 2011. 2. Minocycline was continued after stabilization during     hospitalization.  This is a chronic medication for Douglas Marshall for     acne.  KEY EVENT/STUDIES: 1. On September 26, 2011, the patient had a bradycardic arrest. 2. On September 26, 2011, TEE demonstrates normal LV size and systolic     function with an EF of 60-65% with mild left ventricular     hypertrophy.  Normal RV size and systolic function.  No significant     valvular dysfunction. 3. On September 26, 2011, EEG demonstrates nonspecific slowing, no     seizure focus noted. 4. On September 27, 2011, recurrent bradycardic arrest, status post     temporary pacemaker placement per Cardiology.  LABORATORY DATA:  On September 24, 2011, lithium level is 4.17.  TSH 2.591.  On September 25, 2011, lithium level is 1.22.  On September 26, 2011, lithium level is 1.53.  On September 27, 2011, lithium level  0.86. On September 28, 2011, lithium level 0.53 and on September 29, 2011, lithium level less than 0.25.  On September 30, 2011, lipid profile demonstrates cholesterol 157, triglycerides 176, HDL 32, and LDL 90.  VLDL 35 and total cholesterol-HDL ratio 4.9.  MICRODATA: 1. Blood cultures x2 on September 24, 2011, demonstrate no growth. 2. On September 26, 2011, stool culture demonstrates no Salmonella     Shigella, Campylobacter, or Yersinia noted.  HISTORY OF PRESENT ILLNESS:  Mr.  Douglas Marshall is a 25 year old male with a past medical history of autism, mood disorder, and hypertension.  At baseline, he is known to be a relatively high functioning autistic male. He attends a group school.  On admission, his caregiver indicated that in February, he began to have behavioral issues and was acting out and at that time, had his lithium dose adjusted.  Most recently prior to admission, he was seen at his primary care doctor's office, and his diastolic blood pressure was well over 100.  He was started on lisinopril.  Also noted prior to admission, Douglas Marshall has had several episodes of diarrhea and had become increasingly more somnolent.  He  was brought to the emergency room for further evaluation on September 24, 2011, at which time, he was noted to have a lithium level of 4.2. Psychiatry did see the patient during hospitalization and adjustments were made for the patient's Topamax and also he was started on Abilify for irritability, agitation and aggressive behaviors.  Post-receiving Abilify 5 mg and Haldol 2 mg in the setting of lithium toxicity, he did have twitching noted concerning for seizure-like activity followed by bradycardic arrest.  Abilify was stoppped after one dose. He received atropine x1 and regained sinus rhythm. Emergent placement of hemodialysis catheter was done on admission and he underwent hemodialysis per Nephrology for removal of lithium.  He also was evaluated by TEE, which demonstrated  findings as above.  EEG was evaluated, which demonstrated nonspecific slowing, no seizure focus.  It is thought that twitching activity was likely related to hypoperfusion state.  On September 27, 2011, he did have recurrent bradycardic arrest and had placement of temporary pacemaker wire from September 27, 2011 to September 28, 2011.  All home medications were held.  Post-stabilization, he was restarted on lisinopril.  HOSPITAL COURSE BY DISCHARGE DIAGNOSES: 1. Bradycardic arrest secondary to lithium toxicity.  As per HPI, Mr.     Douglas Marshall is a known 25 year old white male with a past medical history     of autism/mood disorder, fatty liver, hypertension, and     hyperlipidemia, who presented to [facility] on September 24, 2011,     with an acute lithium toxicity.  He did unfortunately have 2     episodes of bradycardic arrest that resolved with atropine.  He did     not require intubation/mechanical ventilation.  He was evaluated by     Nephrology and underwent placement of hemodialysis catheter and 1     round of hemodialysis for removal of lithium.  Lithium levels did     resolve.  Please see above laboratory data for findings.  During     hospitalization, he also did receive Abilify and Haldol prior to     one of his bradycardic arrests.  Abilify was stopped after one dose. Secondary to      brady arrest he underwent placement of a temporary pacer wire, which was removed      on September 28, 2011.  It is recommended at this time that he NEVER be placed back on lithium in     the future.  Prior to admit, Douglas Marshall did have questionable viral     syndrome with diarrhea, which likely precipitated lithium toxicity     in the setting of underlying use of an ACE inhibitor. 2. Encephalopathy/questionable seizures.  Douglas Marshall did have     questionable twitching activity, which was concerning for seizures     during hospitalization, but this was likely related to     hypoperfusion as EEG did not  demonstrate focal seizures.  Neurology     was consulted and did evaluate him during hospitalization.  He did     receive Abilify during the hospitalization and Topamax, both of     those were stopped and Haldol was weaned to off.  Please see the     discharge medications as below. 3. Acute renal failure.  This is in the setting of volume depletion     related to diarrhea, lithium toxicity and ACE inhibitor.  At this time,     acute renal failure has resolved and he has resumed lisinopril. 4. History of  autism/mood disorder.  This is a known historical     diagnosis for Mr. Audino.  Please see discharge medication list as     below for discharge medications.  At the time of discharge, he will     be discharged with Ativan as needed and Risperdal 0.5 mg b.i.d.  He     will also have close follow up with the [facility] in     [place] under the supervision of Brock Bad, Scientist, research (medical). 5. History of fatty liver.  This is a known historical diagnosis for     Mr. Dillenburg. 6. History of hypertension and hyperlipidemia.  Prior to admission,     Mr. Samuelson was on lisinopril and currently with resolution of acute     renal failure in the setting of volume depletion/dehydration.  He     has resumed lisinopril.  DISCHARGE INSTRUCTIONS: 1. Activity as tolerated. 2. Diet, heart healthy. 3. Followup was discussed on the phone with Brock Bad, nurse     practitioner, and she will be arranging follow up appointment with     Mr. Partin mother, Zella Ball, she will call Zella Ball for an appointment.  DISCHARGE MEDICATIONS: 1. Ativan 1 mg by mouth every 8 hours as needed. 2. Risperdal 0.5 mg by mouth twice daily. 3. Lisinopril 20 mg by mouth daily. 4. Minocycline 100 mg by mouth daily.  Mr. Andringa was instructed to stop taking the following medications: 1. Lithium. 2. Haldol. 3. Topiramate.  DISPOSITION AT TIME OF DISCHARGE:  Mr. Craighead has met maximum benefit of inpatient therapy  and is currently medically stable and cleared for discharge.  Pending follow up with Brock Bad, nurse practitioner as above.  TIME SPENT ON DISPOSITION:  Greater than 35 minutes.     Canary Brim, NP   ______________________________ Leslye Peer, MD    BO/MEDQ  D:  09/30/2011  T:  10/01/2011  Job:  [id number]  cc:   Eden Emms, NP  Electronically Signed by Canary Brim  on 10/03/2011 01:49:43 PM Electronically Signed by Levy Pupa MD on 10/04/2011 12:08:22 PM

## 2016-09-08 ENCOUNTER — Emergency Department (HOSPITAL_COMMUNITY)
Admission: EM | Admit: 2016-09-08 | Discharge: 2016-09-08 | Disposition: A | Discharge status: Home / Self Care | Payer: Medicaid Other | Attending: Emergency Medicine | Admitting: Emergency Medicine

## 2016-09-08 ENCOUNTER — Encounter (HOSPITAL_COMMUNITY): Payer: Self-pay | Admitting: Emergency Medicine

## 2016-09-08 ENCOUNTER — Emergency Department (HOSPITAL_COMMUNITY): Payer: Medicaid Other

## 2016-09-08 DIAGNOSIS — M542 Cervicalgia: Secondary | ICD-10-CM | POA: Insufficient documentation

## 2016-09-08 DIAGNOSIS — F840 Autistic disorder: Secondary | ICD-10-CM | POA: Diagnosis not present

## 2016-09-08 DIAGNOSIS — S0990XD Unspecified injury of head, subsequent encounter: Secondary | ICD-10-CM | POA: Diagnosis present

## 2016-09-08 DIAGNOSIS — S060X0D Concussion without loss of consciousness, subsequent encounter: Secondary | ICD-10-CM | POA: Diagnosis not present

## 2016-09-08 DIAGNOSIS — S060X0A Concussion without loss of consciousness, initial encounter: Secondary | ICD-10-CM

## 2016-09-08 MED ORDER — LORAZEPAM 1 MG PO TABS
1.0000 mg | ORAL_TABLET | Freq: Once | ORAL | Status: DC
  Filled 2016-09-08: qty 1
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
## 2016-09-08 NOTE — ED Notes (Signed)
Gave pt Diet Coke, per Dr. Eudelia Bunchardama and Dorathy DaftKayla - PA.
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
